# Patient Record
Sex: Female | Born: 1985 | Race: White | Hispanic: No | Marital: Single | Smoking: Current every day smoker
Health system: Southern US, Community
[De-identification: ages and names within clinical notes are randomized; demographics above are authoritative.]

---

## 2009-02-24 ENCOUNTER — Emergency Department (HOSPITAL_COMMUNITY): Admission: EM | Admit: 2009-02-24 | Discharge: 2009-02-25 | Payer: Self-pay | Admitting: Emergency Medicine

## 2009-07-06 ENCOUNTER — Emergency Department (HOSPITAL_COMMUNITY): Admission: EM | Admit: 2009-07-06 | Discharge: 2009-07-06 | Payer: Self-pay | Admitting: Emergency Medicine

## 2009-11-17 ENCOUNTER — Emergency Department (HOSPITAL_COMMUNITY): Admission: EM | Admit: 2009-11-17 | Discharge: 2009-11-17 | Payer: Self-pay | Admitting: Emergency Medicine

## 2009-12-17 ENCOUNTER — Emergency Department (HOSPITAL_COMMUNITY): Admission: EM | Admit: 2009-12-17 | Discharge: 2009-12-17 | Payer: Self-pay | Admitting: Emergency Medicine

## 2010-02-21 ENCOUNTER — Emergency Department (HOSPITAL_COMMUNITY): Admission: EM | Admit: 2010-02-21 | Discharge: 2010-02-21 | Payer: Self-pay | Admitting: Emergency Medicine

## 2010-09-06 ENCOUNTER — Emergency Department (HOSPITAL_COMMUNITY)
Admission: EM | Admit: 2010-09-06 | Discharge: 2010-09-06 | Payer: Self-pay | Source: Home / Self Care | Admitting: Emergency Medicine
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
## 2013-09-27 ENCOUNTER — Encounter (HOSPITAL_COMMUNITY): Payer: Self-pay | Admitting: Emergency Medicine

## 2013-09-27 ENCOUNTER — Emergency Department (HOSPITAL_COMMUNITY)
Admission: EM | Admit: 2013-09-27 | Discharge: 2013-09-28 | Disposition: A | Discharge status: Home / Self Care | Payer: Self-pay | Attending: Emergency Medicine | Admitting: Emergency Medicine

## 2013-09-27 DIAGNOSIS — K047 Periapical abscess without sinus: Secondary | ICD-10-CM | POA: Insufficient documentation

## 2013-09-27 MED ORDER — PENICILLIN V POTASSIUM 250 MG PO TABS
500.0000 mg | ORAL_TABLET | Freq: Once | ORAL | Status: AC
  Administered 2013-09-27: 500 mg via ORAL
  Filled 2013-09-27: qty 2

## 2013-09-27 MED ORDER — NAPROXEN 500 MG PO TABS: 500.0000 mg | ORAL_TABLET | Freq: Two times a day (BID) | ORAL | Status: DC

## 2013-09-27 MED ORDER — PENICILLIN V POTASSIUM 500 MG PO TABS: 500.0000 mg | ORAL_TABLET | Freq: Four times a day (QID) | ORAL | Status: DC

## 2013-09-27 MED ORDER — OXYCODONE-ACETAMINOPHEN 5-325 MG PO TABS
2.0000 | ORAL_TABLET | Freq: Once | ORAL | Status: AC
  Administered 2013-09-27: 2 via ORAL
  Filled 2013-09-27: qty 2

## 2013-09-27 MED ORDER — OXYCODONE-ACETAMINOPHEN 5-325 MG PO TABS: 1.0000 | ORAL_TABLET | ORAL | Status: DC | PRN

## 2013-09-27 MED ORDER — NAPROXEN 250 MG PO TABS
500.0000 mg | ORAL_TABLET | Freq: Once | ORAL | Status: AC
  Administered 2013-09-27: 500 mg via ORAL
  Filled 2013-09-27: qty 2

## 2013-09-27 NOTE — ED Notes (Signed)
Pt reports dental pain and swelling to right side of face for several days, taking ibuprofen without relief

## 2013-09-27 NOTE — Discharge Instructions (Signed)
[facility] Doctor List ° ° ° °Edward Hawkins MD. Specialty: Pulmonary Disease Contact information: [address]  °[address]  °[place] [address]  °[phone]  ° °Margaret Simpson, MD. Specialty: Family Medicine Contact information: [address]  °[place] [address]  °[phone]  ° °Scott Luking, MD. Specialty: Family Medicine Contact information: [address]  °[address]  °[place] [address]  °[phone]  ° °Tesfaye Fanta, MD Specialty: Internal Medicine Contact information: [address]  °[place] [address]  °[phone]  ° °Zach Hall, MD. Specialty: Internal Medicine Contact information: [address]  °[place] [address]  °[phone]  ° °Angus Mcinnis, MD. Specialty: Family Medicine Contact information: [address]  °[place] [address]  °[phone]  ° °Stephen Knowlton, MD. Specialty: Family Medicine Contact information: [address]  °[address]  °[place] [address]  °[phone]  ° °Roy Fagan, MD. Specialty: Internal Medicine Contact information: [address]  °[address]  ° [place] [address]  °[phone]  ° ° °

## 2013-09-27 NOTE — ED Provider Notes (Signed)
CSN: [id number]     Arrival date & time 09/27/13  2258 History   First MD Initiated Contact with Patient 09/27/13 2325     This chart was scribed for Vida Roller, MD by Arlan Organ, ED Scribe. This patient was seen in room APA12/APA12 and the patient's care was started 11:37 PM.   Chief Complaint  Patient presents with  . Dental Pain   HPI  HPI Comments: Latoya Robinson is a 28 y.o. female who presents to the Emergency Department complaining of gradual onset, gradually worsening, constant, severe dental pain to the right rear second pre molar and bottom right molar that initially started 2 days ago. She denies any recent injury or trauma to her mouth. Denies any recent dental work. Pt states chewing worsens her discomfort, and she denies any alleviating factors at this time. Denies fever or chills. Denies any known allergies. Pt states she currently does not have a dentist to follow up with. No other complaints at this time.  History reviewed. No pertinent past medical history. History reviewed. No pertinent past surgical history. No family history on file. History  Substance Use Topics  . Smoking status: Never Smoker   . Smokeless tobacco: Not on file  . Alcohol Use: No   OB History   Grav Para Term Preterm Abortions TAB SAB Ect Mult Living                 Review of Systems  HENT: Positive for dental problem.   All other systems reviewed and are negative.    Allergies  Review of patient's allergies indicates no known allergies.  Home Medications   Current Outpatient Rx  Name  Route  Sig  Dispense  Refill  . ibuprofen (ADVIL,MOTRIN) 800 MG tablet   Oral   Take 800 mg by mouth every 8 (eight) hours as needed.         . naproxen (NAPROSYN) 500 MG tablet   Oral   Take 1 tablet (500 mg total) by mouth 2 (two) times daily with a meal.   30 tablet   0   . oxyCODONE-acetaminophen (PERCOCET) 5-325 MG per tablet   Oral   Take 1 tablet by mouth every 4 (four) hours  as needed.   20 tablet   0   . penicillin v potassium (VEETID) 500 MG tablet   Oral   Take 1 tablet (500 mg total) by mouth 4 (four) times daily.   40 tablet   0    Triage Vitals: BP 127/73  Pulse 78  Temp(Src) 98.7 F (37.1 C) (Oral)  Resp 18  Ht 5\' 6"  (1.676 m)  Wt 170 lb (77.111 kg)  BMI 27.45 kg/m2  SpO2 100%  LMP 08/29/2013  Physical Exam  Nursing note and vitals reviewed. Constitutional: She is oriented to person, place, and time. She appears well-developed and well-nourished.  HENT:  Head: Normocephalic.   Generalized significant decay of bilateral lower molar, right greater than left Associated swelling of right jaw without fluctuance  Able to open mouth Tongue normal No swelling underneath tongue, no trismus or torticollis, and tenderness to the right gingiva without a defined fluctuant abscess on the lower mandible  Eyes: EOM are normal.  Neck: Normal range of motion. Neck supple.  Very supple neck without any lymphadenopathy or restricted range of motion  Cardiovascular: Normal rate and regular rhythm.   Pulmonary/Chest: Effort normal and breath sounds normal. No respiratory distress. She has no wheezes. She has no  rales.  Abdominal: She exhibits no distension.  Musculoskeletal: Normal range of motion.  Lymphadenopathy:    She has no cervical adenopathy.  Neurological: She is alert and oriented to person, place, and time.  Psychiatric: She has a normal mood and affect.    ED Course  Procedures (including critical care time)  DIAGNOSTIC STUDIES: Oxygen Saturation is 100% on RA, Normal by my interpretation.    COORDINATION OF CARE: 11:40 PM-Will give antibiotics. Discussed treatment plan with pt at bedside and pt agreed to plan.     Labs Review Labs Reviewed - No data to display Imaging Review No results found.  EKG Interpretation   None       MDM   1. Dental abscess    The patient appears uncomfortable from a dental abscess, there is  nothing to drain at this time, the swelling is minimal but there is symmetry is appreciated. I am unable to palpate a fluctuant mass, most her with antibiotics and pain medications, patient is in agreement and will follow up as needed. I encouraged her strongly to followup with a dentist, there is not a dentist on call today through the hospital system  Meds given in ED:  Medications  penicillin v potassium (VEETID) tablet 500 mg (500 mg Oral Given 09/27/13 2346)  oxyCODONE-acetaminophen (PERCOCET/ROXICET) 5-325 MG per tablet 2 tablet (2 tablets Oral Given 09/27/13 2346)  naproxen (NAPROSYN) tablet 500 mg (500 mg Oral Given 09/27/13 2346)    Discharge Medication List as of 09/27/2013 11:53 PM    START taking these medications   Details  naproxen (NAPROSYN) 500 MG tablet Take 1 tablet (500 mg total) by mouth 2 (two) times daily with a meal., Starting 09/27/2013, Until Discontinued, Print    oxyCODONE-acetaminophen (PERCOCET) 5-325 MG per tablet Take 1 tablet by mouth every 4 (four) hours as needed., Starting 09/27/2013, Until Discontinued, Print    penicillin v potassium (VEETID) 500 MG tablet Take 1 tablet (500 mg total) by mouth 4 (four) times daily., Starting 09/27/2013, Until Discontinued, Print          I personally performed the services described in this documentation, which was scribed in my presence. The recorded information has been reviewed and is accurate.      Vida RollerBrian D Demarlo Riojas, MD 09/28/13 [phone]

## 2013-09-28 NOTE — ED Notes (Signed)
Patient with no complaints at this time. Respirations even and unlabored. Skin warm/dry. Discharge instructions reviewed with patient at this time. Patient given opportunity to voice concerns/ask questions. Patient discharged at this time and left Emergency Department with steady gait.   

## 2017-08-24 ENCOUNTER — Emergency Department (HOSPITAL_COMMUNITY)
Admission: EM | Admit: 2017-08-24 | Discharge: 2017-08-24 | Disposition: A | Discharge status: Home / Self Care | Payer: Self-pay | Attending: Emergency Medicine | Admitting: Emergency Medicine

## 2017-08-24 ENCOUNTER — Other Ambulatory Visit: Payer: Self-pay

## 2017-08-24 ENCOUNTER — Encounter (HOSPITAL_COMMUNITY): Payer: Self-pay | Admitting: Emergency Medicine

## 2017-08-24 ENCOUNTER — Emergency Department (HOSPITAL_COMMUNITY): Payer: Self-pay

## 2017-08-24 DIAGNOSIS — S022XXA Fracture of nasal bones, initial encounter for closed fracture: Secondary | ICD-10-CM | POA: Insufficient documentation

## 2017-08-24 DIAGNOSIS — F17210 Nicotine dependence, cigarettes, uncomplicated: Secondary | ICD-10-CM | POA: Insufficient documentation

## 2017-08-24 DIAGNOSIS — W010XXA Fall on same level from slipping, tripping and stumbling without subsequent striking against object, initial encounter: Secondary | ICD-10-CM | POA: Insufficient documentation

## 2017-08-24 DIAGNOSIS — Y939 Activity, unspecified: Secondary | ICD-10-CM | POA: Insufficient documentation

## 2017-08-24 DIAGNOSIS — Y999 Unspecified external cause status: Secondary | ICD-10-CM | POA: Insufficient documentation

## 2017-08-24 DIAGNOSIS — Y929 Unspecified place or not applicable: Secondary | ICD-10-CM | POA: Insufficient documentation

## 2017-08-24 DIAGNOSIS — W19XXXA Unspecified fall, initial encounter: Secondary | ICD-10-CM

## 2017-08-24 MED ORDER — HYDROCODONE-ACETAMINOPHEN 5-325 MG PO TABS: 1.0000 | ORAL_TABLET | Freq: Four times a day (QID) | ORAL | 0 refills | Status: AC | PRN

## 2017-08-24 MED ORDER — KETOROLAC TROMETHAMINE 30 MG/ML IJ SOLN
15.0000 mg | Freq: Once | INTRAMUSCULAR | Status: AC
  Administered 2017-08-24: 15 mg via INTRAMUSCULAR
  Filled 2017-08-24: qty 1

## 2017-08-24 MED ORDER — IBUPROFEN 400 MG PO TABS: 400.0000 mg | ORAL_TABLET | Freq: Three times a day (TID) | ORAL | 0 refills | Status: AC

## 2017-08-24 MED ORDER — HYDROCODONE-ACETAMINOPHEN 5-325 MG PO TABS
1.0000 | ORAL_TABLET | Freq: Once | ORAL | Status: AC
Start: 2017-08-24 — End: 2017-08-24
  Administered 2017-08-24: 1 via ORAL
  Filled 2017-08-24: qty 1

## 2017-08-24 NOTE — ED Provider Notes (Signed)
2:42 PM  [facility] Provider Note   CSN: [id number] Arrival date & time: 08/24/17  1202     History   Chief Complaint Chief Complaint  Patient presents with  . Fall    HPI Latoya Robinson is a 31 y.o. female.  HPI Patient presents 12 hours after sustaining a fall.  When she notes that she was walking, when her pajamas got caught around her foot, she fell striking her head against the floor. There is loss of consciousness for a few moments. Since that time she has had lightheadedness, mild nausea, but no confusion, no vision loss. There is however, increasing pain throughout to the forehead and nasal bridge. Minimal relief with OTC medication. No difficulty swallowing, speaking, chewing. Patient was well prior to the event, denies medical problems, denies taking any blood thinning medicine. History reviewed. No pertinent past medical history.  There are no active problems to display for this patient.   History reviewed. No pertinent surgical history.  OB History    No data available       Home Medications    Prior to Admission medications   Medication Sig Start Date End Date Taking? Authorizing Provider  HYDROcodone-acetaminophen (NORCO/VICODIN) 5-325 MG tablet Take 1 tablet by mouth every 6 (six) hours as needed for severe pain. 08/24/17   Gerhard MunchLockwood, Silvester Reierson, MD  ibuprofen (ADVIL,MOTRIN) 400 MG tablet Take 1 tablet (400 mg total) by mouth 3 (three) times daily for 3 days. Take one tablet three times daily for three days 08/24/17 08/27/17  Gerhard MunchLockwood, Shayra Anton, MD    Family History No family history on file.  Social History Social History   Tobacco Use  . Smoking status: Current Every Day Smoker    Packs/day: 0.50    Types: Cigarettes  . Smokeless tobacco: Never Used  Substance Use Topics  . Alcohol use: No  . Drug use: No     Allergies   Patient has no known allergies.   Review of Systems Review of Systems  Constitutional:     Per HPI, otherwise negative  HENT:       Per HPI, otherwise negative  Respiratory:       Per HPI, otherwise negative  Cardiovascular:       Per HPI, otherwise negative  Gastrointestinal: Positive for nausea. Negative for vomiting.  Endocrine:       Negative aside from HPI  Genitourinary:       Neg aside from HPI   Musculoskeletal:       Per HPI, otherwise negative  Skin: Positive for wound.  Neurological: Positive for light-headedness. Negative for syncope.     Physical Exam Updated Vital Signs BP 124/86 (BP Location: Right Arm)   Pulse 98   Temp 98.3 F (36.8 C) (Oral)   Resp 18   Ht 5\' 7"  (1.702 m)   Wt 81.6 kg (180 lb)   LMP 08/14/2017   SpO2 100%   BMI 28.19 kg/m   Physical Exam  Constitutional: She is oriented to person, place, and time. She appears well-developed and well-nourished. No distress.  HENT:  Head: Normocephalic.    Eyes: Conjunctivae and EOM are normal.  Neck: No spinous process tenderness and no muscular tenderness present. No neck rigidity. No edema, no erythema and normal range of motion present.  Cardiovascular: Normal rate and regular rhythm.  Pulmonary/Chest: Effort normal and breath sounds normal. No stridor. No respiratory distress.  Abdominal: She exhibits no distension.  Musculoskeletal: She exhibits no edema.  Neurological: She is alert and oriented to person, place, and time. No cranial nerve deficit.  Skin: Skin is warm and dry.  Psychiatric: She has a normal mood and affect.  Nursing note and vitals reviewed.    ED Treatments / Results  Labs (all labs ordered are listed, but only abnormal results are displayed) Labs Reviewed - No data to display  EKG  EKG Interpretation None       Radiology Ct Maxillofacial Wo Cm  Result Date: 08/24/2017 CLINICAL DATA:  Larey SeatFell this morning. Swelling to the nose and forehead. EXAM: CT MAXILLOFACIAL WITHOUT CONTRAST TECHNIQUE: Multidetector CT imaging of the maxillofacial structures was  performed. Multiplanar CT image reconstructions were also generated. COMPARISON:  None. FINDINGS: Osseous: Nondisplaced, non comminuted fracture at the base of the right nasal bone. There is a comminuted depressed fracture of the left orbital floor. Oval fat herniates into this. There is no entrapment of the inferior rectus muscle. There is no associated edema. This appears chronic. No other fractures. No osteoblastic or osteolytic lesions. Orbits: Globes and orbits are unremarkable. Sinuses: Clear sinuses, middle ear cavities and mastoid air cells. Soft tissues: There is a contusion extending from the left forehead across the base of the nose. No soft tissue masses. No adenopathy. Limited intracranial: No significant or unexpected finding. IMPRESSION: 1. Acute nondisplaced fracture at the base of the right nasal bone. 2. Depressed comminuted fracture of the left orbital floor. This appears chronic. 3. No other fractures. 4. Soft tissue contusion to the forehead extending to the base of the nose. Electronically Signed   By: Amie Portlandavid  Ormond M.D.   On: 08/24/2017 13:47    Procedures Procedures (including critical care time)  Medications Ordered in ED Medications  ketorolac (TORADOL) 30 MG/ML injection 15 mg (15 mg Intramuscular Given 08/24/17 1333)     Initial Impression / Assessment and Plan / ED Course  I have reviewed the triage vital signs and the nursing notes.  Pertinent labs & imaging results that were available during my care of the patient were reviewed by me and considered in my medical decision making (see chart for details).  On repeat exam the patient is awake alert, in no distress. I discussed all findings with her and her female companion. We did review the CT images, illustrating the possible prior fracture, as well as the new fracture in the right nasal bone. I discussed the importance of following up with ENT as needed, home analgesia, monitoring. Without evidence for intracranial  pathology, with a unremarkable neurologic exam, no indication for additional imaging, monitoring. However, given evidence for new nasal fracture, patient will require outpatient follow-up.   Final Clinical Impressions(s) / ED Diagnoses   Final diagnoses:  Fall, initial encounter  Closed fracture of nasal bone, initial encounter    ED Discharge Orders        Ordered    ibuprofen (ADVIL,MOTRIN) 400 MG tablet  3 times daily     08/24/17 1441    HYDROcodone-acetaminophen (NORCO/VICODIN) 5-325 MG tablet  Every 6 hours PRN     08/24/17 1441       Gerhard MunchLockwood, Jalisia Puchalski, MD 08/24/17 1444

## 2017-08-24 NOTE — ED Triage Notes (Signed)
Reports fall at 0130 on the steps  Positive LOC  Swelling to her forehead and nose Neuro intact No PCP

## 2017-08-24 NOTE — ED Notes (Signed)
Pt taken to ct 

## 2017-10-23 ENCOUNTER — Other Ambulatory Visit: Payer: Self-pay

## 2017-10-23 ENCOUNTER — Encounter (HOSPITAL_COMMUNITY): Payer: Self-pay | Admitting: Emergency Medicine

## 2017-10-23 ENCOUNTER — Emergency Department (HOSPITAL_COMMUNITY)
Admission: EM | Admit: 2017-10-23 | Discharge: 2017-10-23 | Disposition: A | Discharge status: Home / Self Care | Payer: Self-pay | Attending: Emergency Medicine | Admitting: Emergency Medicine

## 2017-10-23 DIAGNOSIS — K029 Dental caries, unspecified: Secondary | ICD-10-CM | POA: Insufficient documentation

## 2017-10-23 DIAGNOSIS — K0889 Other specified disorders of teeth and supporting structures: Secondary | ICD-10-CM

## 2017-10-23 DIAGNOSIS — F17210 Nicotine dependence, cigarettes, uncomplicated: Secondary | ICD-10-CM | POA: Insufficient documentation

## 2017-10-23 MED ORDER — TRAMADOL HCL 50 MG PO TABS
50.0000 mg | ORAL_TABLET | Freq: Once | ORAL | Status: AC
  Administered 2017-10-23: 50 mg via ORAL
  Filled 2017-10-23: qty 1

## 2017-10-23 MED ORDER — TRAMADOL HCL 50 MG PO TABS: 50.0000 mg | ORAL_TABLET | Freq: Four times a day (QID) | ORAL | 0 refills | Status: AC | PRN

## 2017-10-23 MED ORDER — IBUPROFEN 800 MG PO TABS: 800.0000 mg | ORAL_TABLET | Freq: Three times a day (TID) | ORAL | 0 refills | Status: AC

## 2017-10-23 MED ORDER — AMOXICILLIN 250 MG PO CAPS
500.0000 mg | ORAL_CAPSULE | Freq: Once | ORAL | Status: AC
  Administered 2017-10-23: 500 mg via ORAL
  Filled 2017-10-23: qty 2

## 2017-10-23 MED ORDER — AMOXICILLIN 500 MG PO CAPS: 500.0000 mg | ORAL_CAPSULE | Freq: Three times a day (TID) | ORAL | 0 refills | Status: AC

## 2017-10-23 MED ORDER — IBUPROFEN 800 MG PO TABS
800.0000 mg | ORAL_TABLET | Freq: Once | ORAL | Status: AC
  Administered 2017-10-23: 800 mg via ORAL
  Filled 2017-10-23: qty 1

## 2017-10-23 NOTE — ED Triage Notes (Signed)
Tooth pain for a week on left side of month.  Has an appointment with a dentist on Friday

## 2017-10-23 NOTE — ED Notes (Signed)
Pt has an appointment with her dentist on Friday  Here for relief and tx of abscess of tooth
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
## 2017-10-23 NOTE — ED Notes (Signed)
LAST MED  IBUPROFEN 800MG  AR 0800

## 2017-10-23 NOTE — Discharge Instructions (Signed)
Take your entire course of the antibiotics prescribed.  Plan to see the dentist that is being arranged for you.  You may also want to refer to the dentist list provided here.  Do not drive within 4 hours of taking tramadol as this medication may make you drowsy.

## 2018-05-07 ENCOUNTER — Emergency Department (HOSPITAL_COMMUNITY)
Admission: EM | Admit: 2018-05-07 | Discharge: 2018-05-07 | Disposition: A | Discharge status: Home / Self Care | Payer: Self-pay | Attending: Emergency Medicine | Admitting: Emergency Medicine

## 2018-05-07 ENCOUNTER — Other Ambulatory Visit: Payer: Self-pay

## 2018-05-07 ENCOUNTER — Encounter (HOSPITAL_COMMUNITY): Payer: Self-pay | Admitting: Emergency Medicine

## 2018-05-07 DIAGNOSIS — M7989 Other specified soft tissue disorders: Secondary | ICD-10-CM | POA: Insufficient documentation

## 2018-05-07 DIAGNOSIS — M79671 Pain in right foot: Secondary | ICD-10-CM | POA: Insufficient documentation

## 2018-05-07 DIAGNOSIS — Z5321 Procedure and treatment not carried out due to patient leaving prior to being seen by health care provider: Secondary | ICD-10-CM | POA: Insufficient documentation

## 2018-05-07 NOTE — ED Notes (Signed)
Pt not in waiting room at this time x2.

## 2018-05-07 NOTE — ED Notes (Signed)
Pt name called for repeat vitals in waiting room with no answer

## 2018-05-07 NOTE — ED Notes (Signed)
Not in waiting room at this time.

## 2018-05-07 NOTE — ED Triage Notes (Signed)
Patient complains of "spider bite" on right 2nd toe. Patient states pain and swelling. She states she has been putting antibiotic cream and peroxide on wounds. Wound is swollen with erythema spreading to midfoot. Denies nausea, fever.

## 2019-08-26 IMAGING — CT CT MAXILLOFACIAL W/O CM
3 series · 15 of 47 positions shown, 18 images · non-contrast
Comparison: None.

CLINICAL DATA: Fell this morning. Swelling to the nose and
forehead.

EXAM:
CT MAXILLOFACIAL WITHOUT CONTRAST
TECHNIQUE: Multidetector CT imaging of the maxillofacial structures was
performed. Multiplanar CT image reconstructions were also generated.

[Series 2: max soft · axial · 0.31mm/px · z∈[-16,+134]mm · 9 of 87 slices shown, 12 images]
[im 6/87  brain]
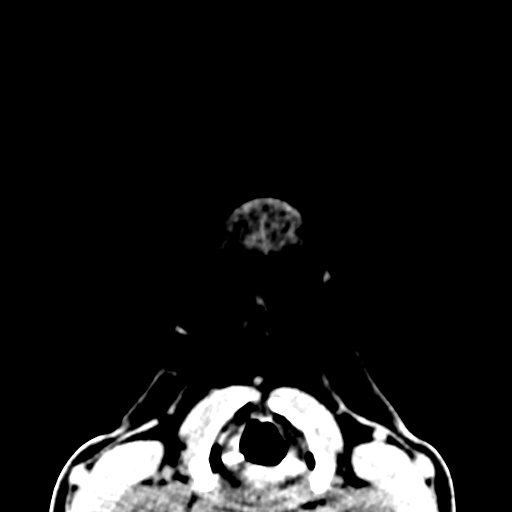
[im 6/87  bone]
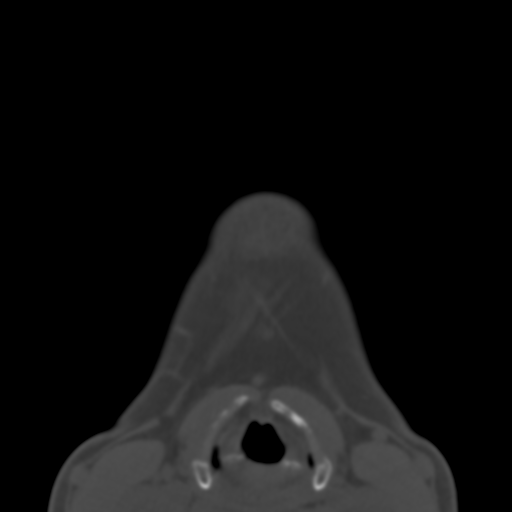
[im 15/87  bone]
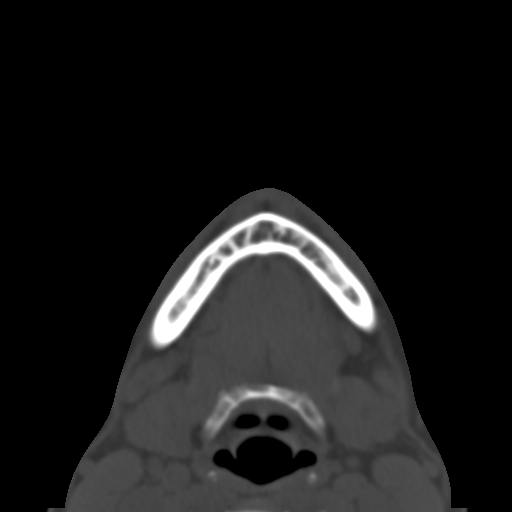
[im 24/87  bone]
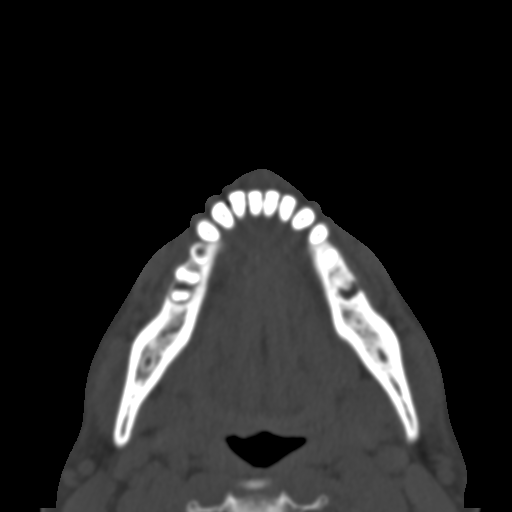
[im 33/87  bone]
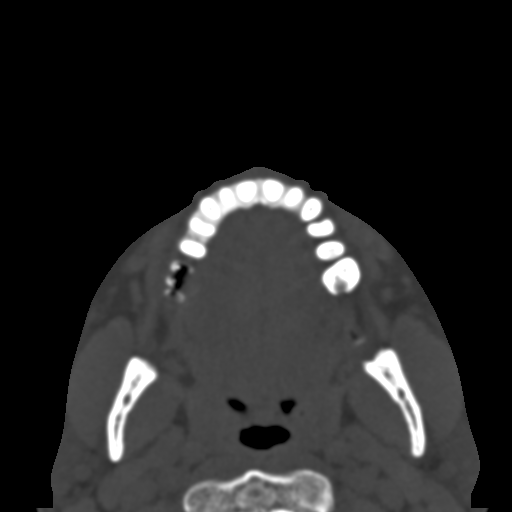
[im 45/87  brain]
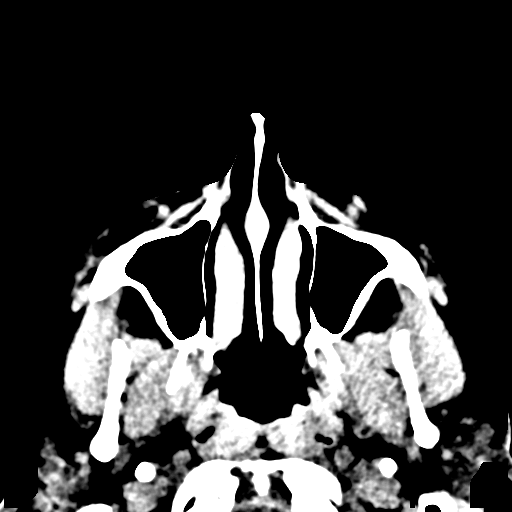
[im 45/87  bone]
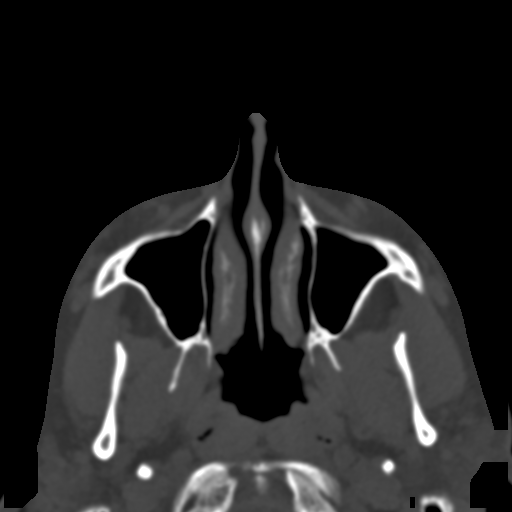
[im 54/87  bone]
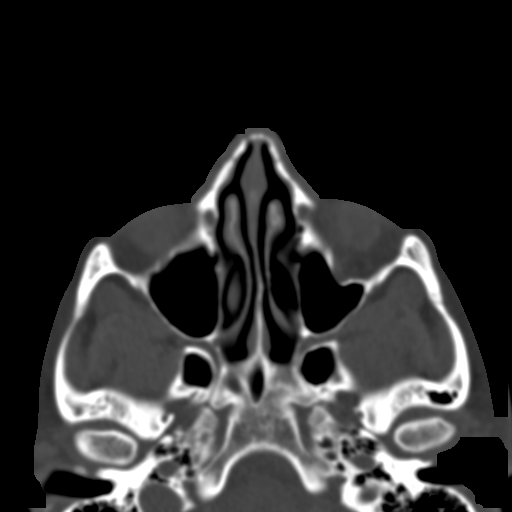
[im 63/87  bone]
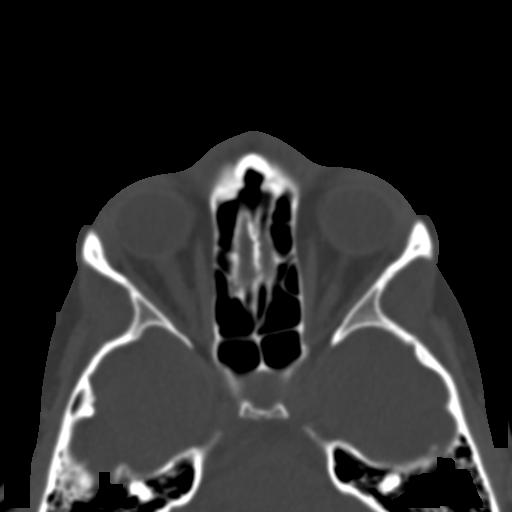
[im 72/87  bone]
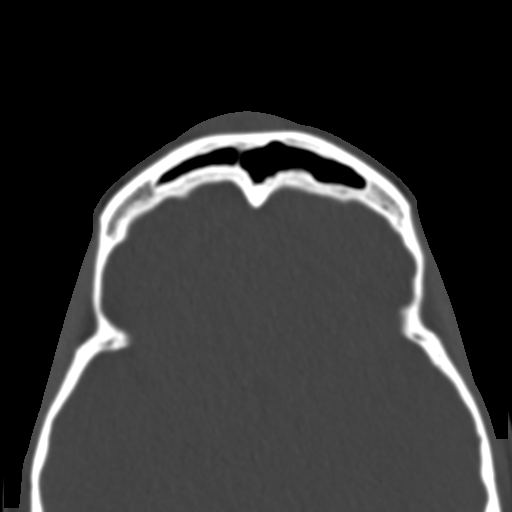
[im 81/87  brain]
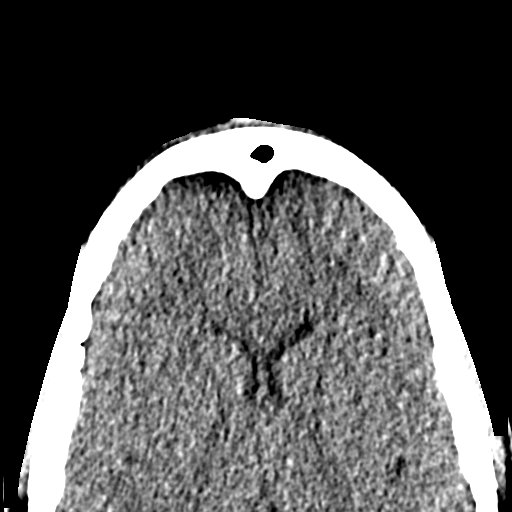
[im 81/87  bone]
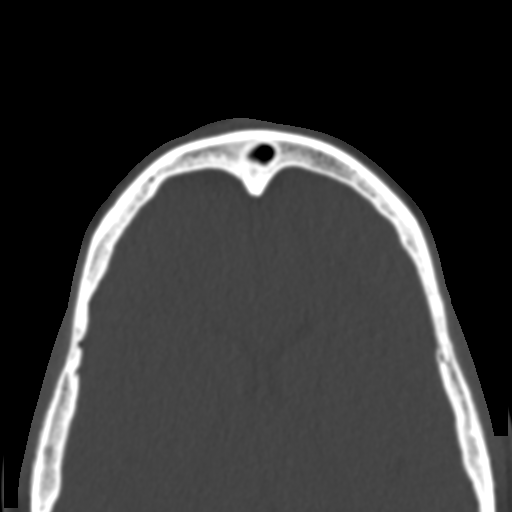

[Series 6: coronal soft · coronal · 0.39mm/px · 3 of 67 slices shown]
[im 23/67  bone]
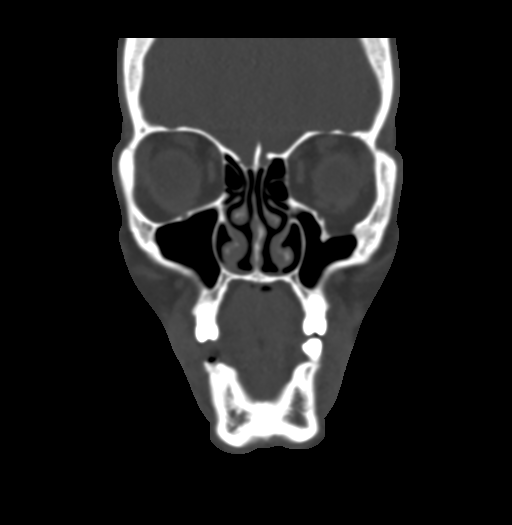
[im 30/67  bone]
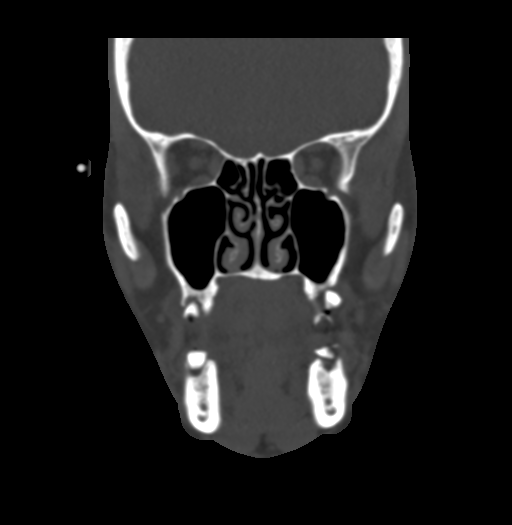
[im 37/67  bone]
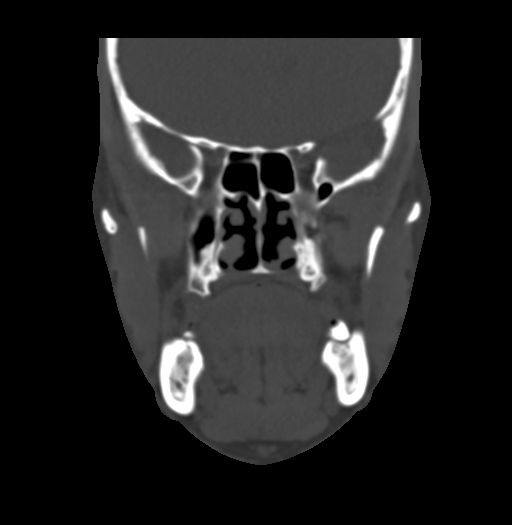

[Series 7: sagittal soft · sagittal · 0.27mm/px · 3 of 78 slices shown]
[im 26/78  bone]
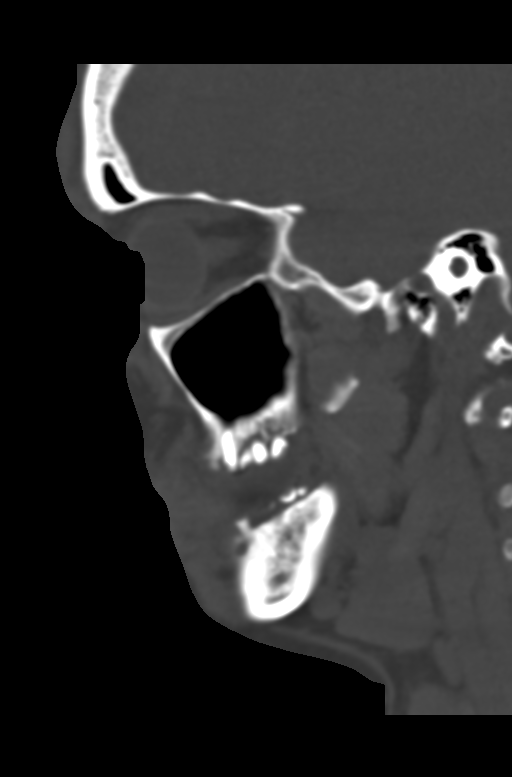
[im 39/78  bone]
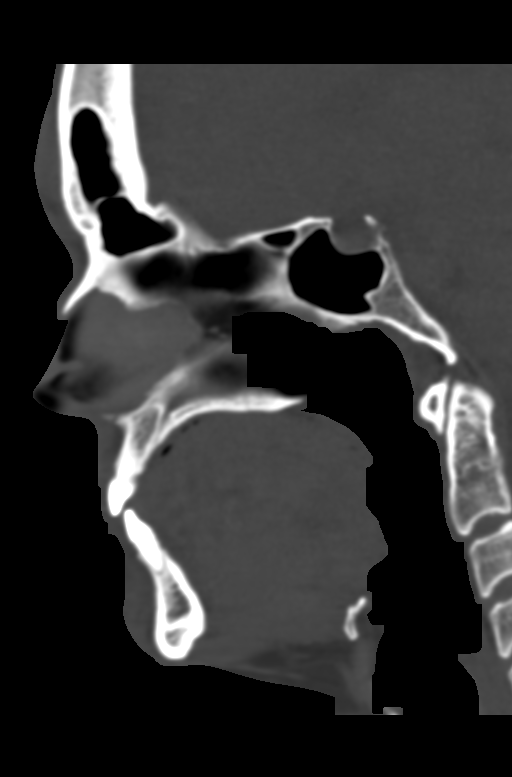
[im 52/78  bone]
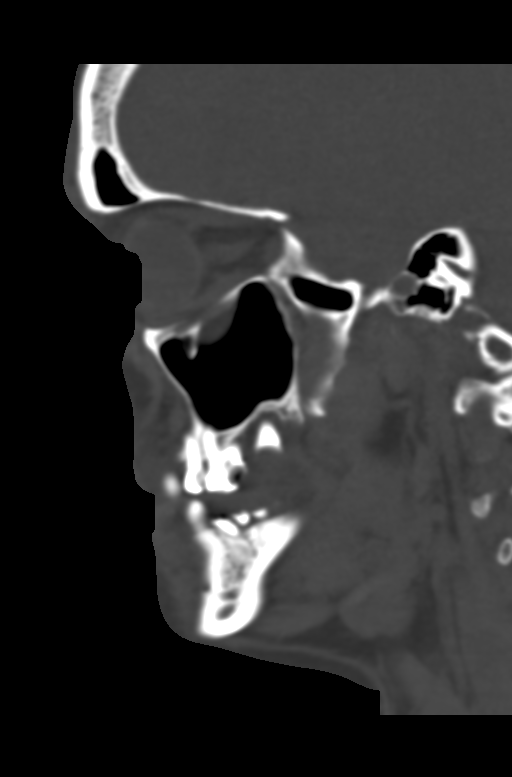

[15 of 47 positions shown; findings below may reference images not displayed]

FINDINGS: Osseous: Nondisplaced, non comminuted fracture at the base of the
right nasal bone. There is a comminuted depressed fracture of the
left orbital floor. Oval fat herniates into this. There is no
entrapment of the inferior rectus muscle. There is no associated
edema. This appears chronic. No other fractures. No osteoblastic or
osteolytic lesions.

Orbits: Globes and orbits are unremarkable.

Sinuses: Clear sinuses, middle ear cavities and mastoid air cells.

Soft tissues: There is a contusion extending from the left forehead
across the base of the nose. No soft tissue masses. No adenopathy.

Limited intracranial: No significant or unexpected finding.
IMPRESSION: 1. Acute nondisplaced fracture at the base of the right nasal bone.
2. Depressed comminuted fracture of the left orbital floor. This
appears chronic.
3. No other fractures.
4. Soft tissue contusion to the forehead extending to the base of
the nose.
